# Patient Record
Sex: Male | Born: 1980 | Race: White | Hispanic: No | Marital: Single | Smoking: Current every day smoker
Health system: Southern US, Community
[De-identification: ages and names within clinical notes are randomized; demographics above are authoritative.]

## PROBLEM LIST (undated history)

## (undated) DIAGNOSIS — R29898 Other symptoms and signs involving the musculoskeletal system: Secondary | ICD-10-CM

## (undated) HISTORY — DX: Other symptoms and signs involving the musculoskeletal system: R29.898

---

## 2018-03-02 ENCOUNTER — Ambulatory Visit: Payer: Self-pay | Admitting: *Deleted

## 2018-03-02 NOTE — Telephone Encounter (Signed)
Pt reports left "wrist drop" and tingling of thumb, index and middle finger left hand. States Saturday night fell asleep in chair "In awkward position." States yesterday am symptoms started but "I thought it was from sleeping on it and it would go away." States "Electrical sensation " to thumb and fingers is intermittent.   Also reports "Wrist drop." States can not lift hand from wrist. Denies any other weakness, no SOB, CP, no facial droop. Denies any warmth to area, no coldness or discoloration to fingers. Reports "Slight swelling, I've been trying to keep it up."  Pt had established care with Dr. Alvy Bimler ; appt. 03/09/18.  Appt made for tomorrow with Dr. Creta Levin for this acute issue. Care advise given per protocol. Instructed to go to ED if symptoms worsen, new symptoms occur.   Reason for Disposition . Weakness (i.e., loss of strength) of new onset in hand or fingers  (Exceptions: not truly weak, hand feels weak because of pain; weakness present > 2 weeks)  Answer Assessment - Initial Assessment Questions 1. ONSET: "When did the pain start?"     Yesterday am 2. LOCATION: "Where is the pain located?"     Left thumb, index and middle finger; left wrist drop 3. PAIN: "How bad is the pain?" (Scale 1-10; or mild, moderate, severe)   - MILD (1-3): doesn't interfere with normal activities   - MODERATE (4-7): interferes with normal activities (e.g., work or school) or awakens from sleep   - SEVERE (8-10): excruciating pain, unable to use hand at all     "Electrical type pain left thumb, first two fingers." 4. WORK OR EXERCISE: "Has there been any recent work or exercise that involved this part of the body?"     "Could have injured it and not realized. Happens in my line of work." 5. CAUSE: "What do you think is causing the pain?"     Unsure 6. AGGRAVATING FACTORS: "What makes the pain worse?" (e.g., using computer)     No, intermittent 7. OTHER SYMPTOMS: "Do you have any other symptoms?" (e.g.,  neck pain, swelling, rash, numbness, fever)     "Very slight swelling. Feels like my hands asleep."  Protocols used: HAND AND WRIST PAIN-A-AH

## 2018-03-03 ENCOUNTER — Ambulatory Visit: Payer: No Typology Code available for payment source | Admitting: Family Medicine

## 2018-03-03 ENCOUNTER — Other Ambulatory Visit: Payer: Self-pay

## 2018-03-03 ENCOUNTER — Encounter: Payer: Self-pay | Admitting: Family Medicine

## 2018-03-03 VITALS — BP 132/79 | HR 67 | Temp 98.3°F | Resp 16 | Ht 76.0 in | Wt 198.0 lb

## 2018-03-03 DIAGNOSIS — M21332 Wrist drop, left wrist: Secondary | ICD-10-CM

## 2018-03-03 NOTE — Progress Notes (Signed)
Chief Complaint  Patient presents with  . tingling thumb, index and middle finger on left hand since 1    numbness and little rom, ? left hand palsy, wrist drop with burning, numbness and tingling in fingers and thumb.    HPI  He works in [employer] and goes through crawl spaces He states that he fells asleep on Friday night 02/27/18 He states that he woke up Saturday morning he woke up with numbness of the wrist He fell asleep in the chair and when he work up had numbness and tingling and could not raise his wrist He only had one drink when this occurred He states that he has the ability to move his fingers when he supports his hand but cannot use his left hand to do anything He can unzip his jacket He can't move his wrist at all He has some burning sensation in the inner grove of his arm pit where he was slounced over the chair Hot things feel hotter than normal  He has a delayed sensation to pain but once he notices the pain it seems very intense.    History reviewed. No pertinent past medical history.  No current outpatient medications on file.   No current facility-administered medications for this visit.     Allergies:  Allergies  Allergen Reactions  . Penicillins Anaphylaxis    History reviewed. No pertinent surgical history.  Social History   Socioeconomic History  . Marital status: Unknown    Spouse name: Not on file  . Number of children: Not on file  . Years of education: Not on file  . Highest education level: Not on file  Occupational History  . Not on file  Social Needs  . Financial resource strain: Not on file  . Food insecurity:    Worry: Not on file    Inability: Not on file  . Transportation needs:    Medical: Not on file    Non-medical: Not on file  Tobacco Use  . Smoking status: Current Every Day Smoker    Types: Cigarettes  . Smokeless tobacco: Never Used  Substance and Sexual Activity  . Alcohol use: Yes    Alcohol/week: 2.0 - 3.0  standard drinks    Types: 2 - 3 Cans of beer per week  . Drug use: Never    Types: Marijuana  . Sexual activity: Not on file  Lifestyle  . Physical activity:    Days per week: Not on file    Minutes per session: Not on file  . Stress: Not on file  Relationships  . Social connections:    Talks on phone: Not on file    Gets together: Not on file    Attends religious service: Not on file    Active member of club or organization: Not on file    Attends meetings of clubs or organizations: Not on file    Relationship status: Not on file  Other Topics Concern  . Not on file  Social History Narrative  . Not on file    History reviewed. No pertinent family history.   ROS Review of Systems See HPI Constitution: No fevers or chills No malaise No diaphoresis Skin: No rash or itching Eyes: no blurry vision, no double vision GU: no dysuria or hematuria Neuro: no dizziness or headaches, see hpi  all others reviewed and negative   Objective: Vitals:   03/03/18 1415  BP: 132/79  Pulse: 67  Resp: 16  Temp: 98.3 F (36.8  C)  TempSrc: Oral  SpO2: 97%  Weight: 198 lb (89.8 kg)  Height: 6\' 4"  (1.93 m)    Physical Exam  Constitutional: He is oriented to person, place, and time. He appears well-developed and well-nourished.  HENT:  Head: Normocephalic and atraumatic.  Eyes: Conjunctivae and EOM are normal.  Cardiovascular: Normal rate, regular rhythm and normal heart sounds.  Pulmonary/Chest: Effort normal and breath sounds normal. No stridor. No respiratory distress.  Neurological: He is alert and oriented to person, place, and time. A sensory deficit is present. No cranial nerve deficit. Gait normal.  Reflex Scores:      Bicep reflexes are 2+ on the right side and 1+ on the left side.      Brachioradialis reflexes are 2+ on the right side and 1+ on the left side. With pin-prick from monofilament he states it feels fuzzy not sharp on the left But sharp on the right  Motor  strength R upper extremity 5/5 Left upper extremity 4/5 Weakness noted at the triceps and BR Weakness at the wrist 1/5   Skin: Skin is warm. Capillary refill takes less than 2 seconds.  Psychiatric: He has a normal mood and affect. His behavior is normal. Judgment and thought content normal.    Assessment and Plan Kenyon was seen today for tingling thumb, index and middle finger on left hand since 1.  Diagnoses and all orders for this visit:  Wrist drop, left -     NCV with EMG(electromyography); Future -     Splint wrist  concerning for Saturday night palsy Gave work note with restrictions Seems to be in the radial nerve distribution Advise wrist splint to support the wrist and precautions Follow up for EMG   Jaicob Dia A [employer]

## 2018-03-03 NOTE — Patient Instructions (Addendum)
If you have lab work done today you will be contacted with your lab results within the next 2 weeks.  If you have not heard from [place] then please contact us. The fastest way to get your results is to register for My Chart.   IF you received an x-ray today, you will receive an invoice from [facility]. Please contact [facility] at [phone] with questions or concerns regarding your invoice.   IF you received labwork today, you will receive an invoice from [facility]. Please contact [facility] at [phone] with questions or concerns regarding your invoice.   Our billing staff will not be able to assist you with questions regarding bills from these companies.  You will be contacted with the lab results as soon as they are available. The fastest way to get your results is to activate your My Chart account. Instructions are located on the last page of this paperwork. If you have not heard from [place] regarding the results in 2 weeks, please contact this office.     Radial Nerve Palsy The loss of function of the radial nerve in your arm or hand is called radial nerve palsy. The radial nerve extends from your shoulder, around the back of your upper arm, and down along the outside of your lower arm. An injury to this nerve causes certain muscles and tendons in your arm and wrist to not work properly, which leads to a condition known as wrist drop. This means that you cannot extend your wrist. If you are standing with your arm stretched straight out in front of you, your wrist will bend and your hand will drop down toward the floor. An injury to the radial nerve may also result in lost feeling (sensation) in parts of your arm. What are the causes? Common causes of this condition include:  A break (fracture) of your upper arm bone (humerus) or the upper part of one of the lower arm bones (radius). This is because the radial nerve winds around both of these bones.  Complications of  the surgical repair of a fractured humerus or radius.  Improper use of crutches. Crutches that are too long can put pressure on the radial nerve where it passes through the armpit (crutch palsy).  Keeping your arm in a position for a long time that places pressure on the radial nerve, such as having your arm over the back of a chair, or during sleep (Saturday night palsy).  Performing repetitive activities that involve rotation of your lower arm or movement of your wrist (repetitive use injury).  What increases the risk? This condition is more likely to develop if you have:  Diabetes.  Rheumatoid arthritis.  Hypothyroidism.  What are the signs or symptoms? Symptoms of this condition include:  Being unable to extend your wrist.  Having difficulty straightening out your elbow in addition to your wrist.  Having numbness or tingling in the back of your arm, forearm, or hand.  How is this diagnosed? This condition is diagnosed with a thorough history of the injury or the circumstances that led up to the wrist drop. Your health care provider will examine your shoulder, arm, wrist, and hand. To confirm the diagnosis, you may also have tests, including:  Nerve conduction studies. These tests show if the radial nerve is sending electrical signals well. If not, the test can be used to figure out where the problem is within the radial nerve.  X-rays. These may be done if your  health care provider suspects that you have an injury to one or more bones in your arm.  MRI. This may be used to determine the cause of your radial nerve palsy, or to make sure there is not another condition causing your symptoms.  How is this treated? Treatment for this condition depends on the cause.  Removing pressure on the radial nerve. This is done if the condition is caused by pressure on the nerve. Using this treatment may allow the nerve to go back to normal within a few weeks or a few months.  Physical  and occupational therapy. Your health care provider may ask you to work with a therapist to regain strength in your hand and wrist and perform stretching exercises to maintain range of motion of your wrist and elbow.  Splinting. Your therapist may make one or more splints for you to wear during the day or at night to help with motion and positioning of your wrist.  Medicines. ? A steroid injection may be used to decrease swelling around the nerve. ? Nonsteroidal anti-inflammatory drugs (NSAIDs) may be used to control pain.  Surgery. Depending on the cause of your radial nerve palsy, surgery may be required to: ? Remove pressure on the nerve (entrapment). ? Repair one or more broken bones. ? Relocate (transfer) tendons in your lower arm to help you regain strength and mobility of your wrist. ? Transfer of a nerve to the injury site to restore nerve function.  Follow these instructions at home:  Follow your health care provider's instructions about how to protect your hand and wrist.  If you have been given one or more braces to wear, use them as directed by your health care provider or therapist.  Protect your hand from extreme temperature injuries, such as burns and frostbite, as directed by your health care provider or therapist.  Exercise your hand, wrist, and arm on a regular basis as directed by your health care provider or therapist. Contact a health care provider if:  You have a sudden increase in pain.  You develop new numbness or new loss of sensation in your hand.  You have a sudden change in your ability to move your arm, wrist, or hand.  Your fingers become bluish in color or they feel cold to the touch. This information is not intended to replace advice given to you by your health care provider. Make sure you discuss any questions you have with your health care provider. Document Released: 12/13/2005 Document Revised: 09/14/2015 Document Reviewed: 06/23/2013 [employer]  Interactive Patient Education  2018 [employer]

## 2018-03-04 ENCOUNTER — Encounter: Payer: Self-pay | Admitting: Family Medicine

## 2018-03-06 ENCOUNTER — Emergency Department (HOSPITAL_COMMUNITY): Payer: No Typology Code available for payment source

## 2018-03-06 ENCOUNTER — Telehealth: Payer: Self-pay | Admitting: Family Medicine

## 2018-03-06 ENCOUNTER — Other Ambulatory Visit: Payer: Self-pay

## 2018-03-06 ENCOUNTER — Encounter (HOSPITAL_COMMUNITY): Payer: Self-pay | Admitting: Emergency Medicine

## 2018-03-06 ENCOUNTER — Emergency Department (HOSPITAL_COMMUNITY)
Admission: EM | Admit: 2018-03-06 | Discharge: 2018-03-06 | Disposition: A | Discharge status: Home / Self Care | Payer: No Typology Code available for payment source | Attending: Emergency Medicine | Admitting: Emergency Medicine

## 2018-03-06 DIAGNOSIS — M25532 Pain in left wrist: Secondary | ICD-10-CM | POA: Diagnosis present

## 2018-03-06 DIAGNOSIS — F17210 Nicotine dependence, cigarettes, uncomplicated: Secondary | ICD-10-CM | POA: Diagnosis not present

## 2018-03-06 DIAGNOSIS — G5632 Lesion of radial nerve, left upper limb: Secondary | ICD-10-CM | POA: Diagnosis not present

## 2018-03-06 DIAGNOSIS — R200 Anesthesia of skin: Secondary | ICD-10-CM | POA: Insufficient documentation

## 2018-03-06 DIAGNOSIS — M5020 Other cervical disc displacement, unspecified cervical region: Secondary | ICD-10-CM

## 2018-03-06 LAB — I-STAT CHEM 8, ED
BUN: 11 mg/dL (ref 6–20)
CALCIUM ION: 1.16 mmol/L (ref 1.15–1.40)
CHLORIDE: 102 mmol/L (ref 98–111)
Creatinine, Ser: 0.9 mg/dL (ref 0.61–1.24)
GLUCOSE: 83 mg/dL (ref 70–99)
HCT: 51 % (ref 39.0–52.0)
Hemoglobin: 17.3 g/dL — ABNORMAL HIGH (ref 13.0–17.0)
Potassium: 5.2 mmol/L — ABNORMAL HIGH (ref 3.5–5.1)
SODIUM: 137 mmol/L (ref 135–145)
TCO2: 28 mmol/L (ref 22–32)

## 2018-03-06 LAB — URINALYSIS, ROUTINE W REFLEX MICROSCOPIC
BILIRUBIN URINE: NEGATIVE
Glucose, UA: NEGATIVE mg/dL
Hgb urine dipstick: NEGATIVE
KETONES UR: NEGATIVE mg/dL
Leukocytes, UA: NEGATIVE
Nitrite: NEGATIVE
PH: 6 (ref 5.0–8.0)
Protein, ur: NEGATIVE mg/dL
Specific Gravity, Urine: 1.009 (ref 1.005–1.030)

## 2018-03-06 LAB — CBC
HEMATOCRIT: 52.2 % — AB (ref 39.0–52.0)
HEMOGLOBIN: 16.5 g/dL (ref 13.0–17.0)
MCH: 30.4 pg (ref 26.0–34.0)
MCHC: 31.6 g/dL (ref 30.0–36.0)
MCV: 96.3 fL (ref 80.0–100.0)
Platelets: 226 10*3/uL (ref 150–400)
RBC: 5.42 MIL/uL (ref 4.22–5.81)
RDW: 12.6 % (ref 11.5–15.5)
WBC: 9.1 10*3/uL (ref 4.0–10.5)
nRBC: 0 % (ref 0.0–0.2)

## 2018-03-06 LAB — DIFFERENTIAL
Abs Immature Granulocytes: 0.04 10*3/uL (ref 0.00–0.07)
Basophils Absolute: 0 10*3/uL (ref 0.0–0.1)
Basophils Relative: 0 %
Eosinophils Absolute: 0.2 10*3/uL (ref 0.0–0.5)
Eosinophils Relative: 2 %
IMMATURE GRANULOCYTES: 0 %
LYMPHS ABS: 2.6 10*3/uL (ref 0.7–4.0)
LYMPHS PCT: 29 %
Monocytes Absolute: 0.7 10*3/uL (ref 0.1–1.0)
Monocytes Relative: 7 %
NEUTROS ABS: 5.5 10*3/uL (ref 1.7–7.7)
Neutrophils Relative %: 62 %

## 2018-03-06 LAB — RAPID URINE DRUG SCREEN, HOSP PERFORMED
AMPHETAMINES: NOT DETECTED
BARBITURATES: NOT DETECTED
Benzodiazepines: NOT DETECTED
Cocaine: NOT DETECTED
OPIATES: NOT DETECTED
TETRAHYDROCANNABINOL: NOT DETECTED

## 2018-03-06 LAB — APTT: aPTT: 33 seconds (ref 24–36)

## 2018-03-06 LAB — COMPREHENSIVE METABOLIC PANEL
ALBUMIN: 4.4 g/dL (ref 3.5–5.0)
ALK PHOS: 52 U/L (ref 38–126)
ALT: 13 U/L (ref 0–44)
AST: 17 U/L (ref 15–41)
Anion gap: 9 (ref 5–15)
BUN: 7 mg/dL (ref 6–20)
CALCIUM: 9.8 mg/dL (ref 8.9–10.3)
CO2: 28 mmol/L (ref 22–32)
CREATININE: 1.01 mg/dL (ref 0.61–1.24)
Chloride: 101 mmol/L (ref 98–111)
GFR calc Af Amer: >60 mL/min (ref >60)
GFR calc non Af Amer: >60 mL/min (ref >60)
GLUCOSE: 86 mg/dL (ref 70–99)
Potassium: 5.1 mmol/L (ref 3.5–5.1)
Sodium: 138 mmol/L (ref 135–145)
Total Bilirubin: 0.5 mg/dL (ref 0.3–1.2)
Total Protein: 7.6 g/dL (ref 6.5–8.1)

## 2018-03-06 LAB — ETHANOL

## 2018-03-06 LAB — I-STAT TROPONIN, ED: Troponin i, poc: 0 ng/mL (ref 0.00–0.08)

## 2018-03-06 LAB — PROTIME-INR
INR: 0.95
Prothrombin Time: 12.5 seconds (ref 11.4–15.2)

## 2018-03-06 MED ORDER — PREDNISONE 10 MG PO TABS: 40.0000 mg | ORAL_TABLET | Freq: Every day | ORAL | 0 refills | Status: DC

## 2018-03-06 MED ORDER — METHYLPREDNISOLONE SODIUM SUCC 125 MG IJ SOLR
125.0000 mg | Freq: Once | INTRAMUSCULAR | Status: AC
  Administered 2018-03-06: 125 mg via INTRAVENOUS
  Filled 2018-03-06: qty 2

## 2018-03-06 NOTE — ED Provider Notes (Signed)
[facility] EMERGENCY DEPARTMENT Provider Note   CSN: [id number] Arrival date & time: 03/06/18  1229     History   Chief Complaint Chief Complaint  Patient presents with  . Wrist Pain    HPI Sean Barnes is a 37 y.o. male who presents to ED for 1 week history of nondominant left wrist drop.  States that he woke up one morning with the inability to extend his left wrist.  He began developing paresthesias and "numbness" on the dorsum of his hand that began at the fingertips and has now radiated up his arm.  He states that the paresthesias are more severe in his first through third digits.  He denies any history of similar symptoms in the past.  He was seen and evaluated by a provider a few days ago with inconclusive diagnosis or treatment plan.  He has not taken any medications to help with his symptoms.  He states that "it is very possible that I could have slept on it wrong a week ago but I cannot remember."  He denies any gait changes, injuries or falls, headache, vision changes.  HPI  History reviewed. No pertinent past medical history.  There are no active problems to display for this patient.   History reviewed. No pertinent surgical history.      Home Medications    Prior to Admission medications   Not on File    Family History No family history on file.  Social History Social History   Tobacco Use  . Smoking status: Current Every Day Smoker    Types: Cigarettes  . Smokeless tobacco: Never Used  Substance Use Topics  . Alcohol use: Yes    Alcohol/week: 2.0 - 3.0 standard drinks    Types: 2 - 3 Cans of beer per week  . Drug use: Never    Types: Marijuana     Allergies   Penicillins   Review of Systems Review of Systems  Constitutional: Negative for appetite change, chills and fever.  HENT: Negative for ear pain, rhinorrhea, sneezing and sore throat.   Eyes: Negative for photophobia and visual disturbance.  Respiratory: Negative  for cough, chest tightness, shortness of breath and wheezing.   Cardiovascular: Negative for chest pain and palpitations.  Gastrointestinal: Negative for abdominal pain, blood in stool, constipation, diarrhea, nausea and vomiting.  Genitourinary: Negative for dysuria, hematuria and urgency.  Musculoskeletal: Negative for myalgias.  Skin: Negative for rash.  Neurological: Positive for weakness and numbness. Negative for dizziness and light-headedness.     Physical Exam Updated Vital Signs BP 129/73 (BP Location: Left Arm)   Pulse 60   Temp 98.4 F (36.9 C) (Oral)   Resp 16   Wt 89.8 kg   SpO2 99%   BMI 24.10 kg/m   Physical Exam  Constitutional: He is oriented to person, place, and time. He appears well-developed and well-nourished. No distress.  Normal gait. NAD.  HENT:  Head: Normocephalic and atraumatic.  Nose: Nose normal.  Eyes: Conjunctivae and EOM are normal. Left eye exhibits no discharge. No scleral icterus.  Neck: Normal range of motion. Neck supple.  Cardiovascular: Normal rate, regular rhythm, normal heart sounds and intact distal pulses. Exam reveals no gallop and no friction rub.  No murmur heard. Pulmonary/Chest: Effort normal and breath sounds normal. No respiratory distress.  Abdominal: Soft. Bowel sounds are normal. He exhibits no distension. There is no tenderness. There is no guarding.  Musculoskeletal: Normal range of motion. He exhibits no edema.  Wrist drop noted of left wrist.  Patient notes "different" sensation to touch of left upper extremity.  Good capillary refill noted.  2+ radial pulse palpated.  Weakness with grip strength noted compared to right.  No tenderness to palpation of the left wrist or elbow.  Decreased strength on LUE triceps and biceps.  Neurological: He is alert and oriented to person, place, and time. A sensory deficit is present. No cranial nerve deficit. He exhibits normal muscle tone. Coordination normal.  Pupils reactive. No facial  asymmetry noted. Cranial nerves appear grossly intact. Sensation intact to light touch on face.  Skin: Skin is warm and dry. No rash noted.  Psychiatric: He has a normal mood and affect.  Nursing note and vitals reviewed.    ED Treatments / Results  Labs (all labs ordered are listed, but only abnormal results are displayed) Labs Reviewed  CBC - Abnormal; Notable for the following components:      Result Value   HCT 52.2 (*)    All other components within normal limits  I-STAT CHEM 8, ED - Abnormal; Notable for the following components:   Potassium 5.2 (*)    Hemoglobin 17.3 (*)    All other components within normal limits  ETHANOL  PROTIME-INR  APTT  DIFFERENTIAL  COMPREHENSIVE METABOLIC PANEL  URINALYSIS, ROUTINE W REFLEX MICROSCOPIC  RAPID URINE DRUG SCREEN, HOSP PERFORMED  I-STAT TROPONIN, ED    EKG EKG Interpretation  Date/Time:  Friday March 06 2018 13:23:53 EST Ventricular Rate:  77 PR Interval:    QRS Duration: 103 QT Interval:  374 QTC Calculation: 424 R Axis:   130 Text Interpretation:  Sinus rhythm Biatrial enlargement Anterior infarct, old ST elevation, consider inferior injury Baseline wander in lead(s) V6 Confirmed by Donnetta Hutching ([id number]), editor Manson Passey, Deirdre [phone]) on 03/06/2018 2:04:20 PM   Radiology Ct Head Wo Contrast  Result Date: 03/06/2018 CLINICAL DATA:  Numbness left forearm to fingers for several weeks. There is poor pronation of the left hand. EXAM: CT HEAD WITHOUT CONTRAST CT CERVICAL SPINE WITHOUT CONTRAST TECHNIQUE: Multidetector CT imaging of the head and cervical spine was performed following the standard protocol without intravenous contrast. Multiplanar CT image reconstructions of the cervical spine were also generated. COMPARISON:  None. FINDINGS: CT HEAD FINDINGS BRAIN: The ventricles and sulci are normal. No intraparenchymal hemorrhage, mass effect nor midline shift. No acute large vascular territory infarcts. No abnormal  extra-axial fluid collections. Basal cisterns are midline and not effaced. No acute cerebellar abnormality. VASCULAR: Unremarkable. SKULL/SOFT TISSUES: No skull fracture. No significant soft tissue swelling. ORBITS/SINUSES: The included ocular globes and orbital contents are normal.The mastoid air-cells and included paranasal sinuses are well-aerated. OTHER: None. CT CERVICAL SPINE FINDINGS ALIGNMENT: Vertebral bodies in alignment. Maintained lordosis. Mild dextroconvex curvature of the lower cervical spine with apex at C6. SKULL BASE AND VERTEBRAE: Cervical vertebral bodies and posterior elements are intact. No destructive or suspicious osseous lesions. C1-2 articulation maintained. Uncinate spurring at C5-6 bilaterally from uncovertebral joint osteoarthritis. SOFT TISSUES AND SPINAL CANAL: No prevertebral soft tissue swelling. No visible canal hematoma. DISC LEVELS: C2-C3: Small central disc protrusion. No significant central foraminal stenosis. C3-C4:  Negative C4-C5: Moderate disc flattening with mild central to left central disc bulge. No significant central or foraminal stenosis. C5-C6: Moderate left central to subarticular disc protrusion with left central osteophytes noted which may affect the exiting nerve roots at C5-6. This may be better characterized with MRI. C6-C7:  Negative C7-T1:  Negative UPPER CHEST: Lung apices are  clear. OTHER: None. IMPRESSION: 1. Normal head CT. 2. Moderate left central to subarticular disc protrusion at C5-6 with dorsal osteophytes which may affect the left exiting nerve roots at this level. This may be better characterized with MRI. 3. Small central disc protrusion C2-3. 4. Left mild central disc bulge C4-5. Electronically Signed   By: Tollie Eth M.D.   On: 03/06/2018 15:07   Ct Cervical Spine Wo Contrast  Result Date: 03/06/2018 CLINICAL DATA:  Numbness left forearm to fingers for several weeks. There is poor pronation of the left hand. EXAM: CT HEAD WITHOUT CONTRAST  CT CERVICAL SPINE WITHOUT CONTRAST TECHNIQUE: Multidetector CT imaging of the head and cervical spine was performed following the standard protocol without intravenous contrast. Multiplanar CT image reconstructions of the cervical spine were also generated. COMPARISON:  None. FINDINGS: CT HEAD FINDINGS BRAIN: The ventricles and sulci are normal. No intraparenchymal hemorrhage, mass effect nor midline shift. No acute large vascular territory infarcts. No abnormal extra-axial fluid collections. Basal cisterns are midline and not effaced. No acute cerebellar abnormality. VASCULAR: Unremarkable. SKULL/SOFT TISSUES: No skull fracture. No significant soft tissue swelling. ORBITS/SINUSES: The included ocular globes and orbital contents are normal.The mastoid air-cells and included paranasal sinuses are well-aerated. OTHER: None. CT CERVICAL SPINE FINDINGS ALIGNMENT: Vertebral bodies in alignment. Maintained lordosis. Mild dextroconvex curvature of the lower cervical spine with apex at C6. SKULL BASE AND VERTEBRAE: Cervical vertebral bodies and posterior elements are intact. No destructive or suspicious osseous lesions. C1-2 articulation maintained. Uncinate spurring at C5-6 bilaterally from uncovertebral joint osteoarthritis. SOFT TISSUES AND SPINAL CANAL: No prevertebral soft tissue swelling. No visible canal hematoma. DISC LEVELS: C2-C3: Small central disc protrusion. No significant central foraminal stenosis. C3-C4:  Negative C4-C5: Moderate disc flattening with mild central to left central disc bulge. No significant central or foraminal stenosis. C5-C6: Moderate left central to subarticular disc protrusion with left central osteophytes noted which may affect the exiting nerve roots at C5-6. This may be better characterized with MRI. C6-C7:  Negative C7-T1:  Negative UPPER CHEST: Lung apices are clear. OTHER: None. IMPRESSION: 1. Normal head CT. 2. Moderate left central to subarticular disc protrusion at C5-6 with  dorsal osteophytes which may affect the left exiting nerve roots at this level. This may be better characterized with MRI. 3. Small central disc protrusion C2-3. 4. Left mild central disc bulge C4-5. Electronically Signed   By: Tollie Eth M.D.   On: 03/06/2018 15:07    Procedures Procedures (including critical care time)  Medications Ordered in ED Medications  methylPREDNISolone sodium succinate (SOLU-MEDROL) 125 mg/2 mL injection 125 mg (125 mg Intravenous Given 03/06/18 1547)     Initial Impression / Assessment and Plan / ED Course  I have reviewed the triage vital signs and the nursing notes.  Pertinent labs & imaging results that were available during my care of the patient were reviewed by me and considered in my medical decision making (see chart for details).     37 year old male presents to ED for 1 week history of nondominant left wrist drop, paresthesias and "numbness."  States that symptoms began 1 week ago and he is unsure if he slept on his arm incorrectly.  He was seen and evaluated 3 days ago for the symptoms but his symptoms have not improved despite wearing the wrist brace.  On exam there is left-sided wrist drop noted.  There is decreased strength noted in the left upper extremity although there is full active and passive range of motion  of shoulder and elbow.  Patient is emulating with a normal gait.  No facial droop or neurological deficits of lower extremity noted.  Plan is to obtain baseline lab work, CT of the head and neck, MRI of the cervical spine to rule out central cause of symptoms.  Baseline lab work CBC, troponin, CMP unremarkable.  CT of the head and cervical spine significant for moderate left central to subarticular disc extrusion at C5-C6 which may be affecting the nerve roots at this level.  Will need to still obtain cervical spinal MRI and reassess.  We will give patient a dose of Solu-Medrol. Dr. Otho Najjarozier to disposition patient after MR results.  Patient  discussed with and seen by my attending, Dr. Silverio LayYao.   Final Clinical Impressions(s) / ED Diagnoses   Final diagnoses:  Radial nerve palsy, left    ED Discharge Orders    None       Dietrich PatesKhatri, Undra Harriman, PA-C 03/06/18 1611    Charlynne PanderYao, David Hsienta, MD 03/09/18 [id number]

## 2018-03-06 NOTE — Telephone Encounter (Signed)
I put in for an EMG

## 2018-03-06 NOTE — ED Triage Notes (Signed)
Pt in from home with c/o L wrist pain, and L posterior ankle pain x 1 wk. Pt states he has numbness from L forearm to fingers, poor pronation but able to move fingers dependent on certain positioning.

## 2018-03-06 NOTE — Telephone Encounter (Signed)
Was there a referral that was suppose to be placed for this patient or did he need to make a follow up appointment. There are no notes stating either in his chart.  Please advise--also looks as though patient was seen in ER today 03/06/18

## 2018-03-06 NOTE — Telephone Encounter (Signed)
Copied from CRM [id number]. Topic: General - Other >> Mar 05, 2018  2:04 PM Lynne LoganHudson, Caryn D wrote: Reason for CRM: Pt was calling to check on the status of next steps regarding his wrist. Pt stated Dr. Creta LevinStallings told him someone would be calling him to schedule but he has not heard anything. Please advise Cb#Comm Pref:  Works at [employer] [other [address]  [place] [id number] [phone]

## 2018-03-06 NOTE — ED Provider Notes (Signed)
Transfer of Care Note I [facility] care [facility].  Agree with history, physical exam and plan.  See their note for further details.    Briefly, the patient is a 37 yo male who presents with left sided wrist drop.  Current plan is as follows: Patient is currently awaiting results of an MRI of his cervical spine.   Reassessment: I personally reassessed the patient.  Continues to have left-sided wrist drop but no acute changes in his condition.  MRI is read of having region with accompanying osteophytes of the left at C5/C6 which could compress the left C6 nerve.  This finding was discussed with the patient at bedside.  After discussing these findings he is amenable to following up with neurosurgery as an outpatient and starting a short course of steroids at this time.  Patient will call the neurosurgery office tomorrow with the information provided and will return to the emerge department immediately should his symptoms acutely change or if any new, concerning symptoms should arise.  Patient no acute distress at time of discharge.  The care of this patient was supervised by Dr. Clarene DukeLittle, who agreed with the plan and management of the patient.   Clinical Impression: 1. Radial nerve palsy, left   2. Compressed cervical disc     ED Dispo: Discharge    Keith Rakeozier, Merion Grimaldo T, MD 03/06/18 1932    Clarene DukeLittle, Ambrose Finlandachel Morgan, MD 03/07/18 [phone]

## 2018-03-09 ENCOUNTER — Ambulatory Visit: Payer: Self-pay | Admitting: Emergency Medicine

## 2018-03-11 NOTE — Progress Notes (Signed)
Patient ID: Sean Barnes, male   DOB: 12-30-1980, 37 y.o.   MRN: [id number]      Sean IshiharaRobert Barnes, is a 37 y.o. male  NWG:[id number]  VHQ:[id number]  DOB - 12-30-1980  Subjective:  Chief Complaint and HPI: Sean Barnes is a 37 y.o. male here today Presented to ED with L wrist droop on 03/06/2018.  He awakened acutely with it on 03/06/2018.  There was no precipitating factors or injury.  +weakness.  Appt with neurosurgery on Monday with [facility].  No improvement with prednisone.  +occasional shooting nerve pains.  MRI C-Spine  03/06/2018: IMPRESSION: Soft disc protrusion with accompanying osteophytes to the left at C5-6 which should compress the left C6 nerve.  CT head  03/06/2018 IMPRESSION: 1. Normal head CT. 2. Moderate left central to subarticular disc protrusion at C5-6 with dorsal osteophytes which may affect the left exiting nerve roots at this level. This may be better characterized with MRI. 3. Small central disc protrusion C2-3. 4. Left mild central disc bulge C4-5.  CT C-Spine: IMPRESSION: 1. Normal head CT. 2. Moderate left central to subarticular disc protrusion at C5-6 with dorsal osteophytes which may affect the left exiting nerve roots at this level. This may be better characterized with MRI. 3. Small central disc protrusion C2-3. 4. Left mild central disc bulge C4-5.   HPI Sean IshiharaRobert Barnes is a 37 y.o. male who presents to ED for 1 week history of nondominant left wrist drop.  States that he woke up one morning with the inability to extend his left wrist.  He began developing paresthesias and "numbness" on the dorsum of his hand that began at the fingertips and has now radiated up his arm.  He states that the paresthesias are more severe in his first through third digits.  He denies any history of similar symptoms in the past.  He was seen and evaluated by a provider a few days ago with inconclusive diagnosis or treatment plan.  He has not taken any medications to help  with his symptoms.  He states that "it is very possible that I could have slept on it wrong a week ago but I cannot remember."  He denies any gait changes, injuries or falls, headache, vision changes.  EKG: Date/Time:                  Friday March 06 2018 13:23:53 EST Ventricular Rate:         77 PR Interval:                   QRS Duration: 103 QT Interval:                 374 QTC Calculation:        424 R Axis:                         130 Text Interpretation:       Sinus rhythm Biatrial enlargement Anterior infarct, old ST elevation, consider inferior injury Baseline wander in lead(s) V6 Confirmed by Donnetta Hutchingook, Brian ([id number]), editor Manson PasseyBrown, Deirdre [phone]([id number]) on 03/06/2018 2:04:20 PM  Pertinent labs & imaging results that were available during my care of the patient were reviewed by me and considered in my medical decision making (see chart for details).   37 year old male presents to ED for 1 week history of nondominant left wrist drop, paresthesias and "numbness."  States that symptoms began 1 week ago and he is unsure if he  slept on his arm incorrectly.  He was seen and evaluated 3 days ago for the symptoms but his symptoms have not improved despite wearing the wrist brace.  On exam there is left-sided wrist drop noted.  There is decreased strength noted in the left upper extremity although there is full active and passive range of motion of shoulder and elbow.  Patient is emulating with a normal gait.  No facial droop or neurological deficits of lower extremity noted.  Plan is to obtain baseline lab work, CT of the head and neck, MRI of the cervical spine to rule out central cause of symptoms.  Baseline lab work CBC, troponin, CMP unremarkable.  CT of the head and cervical spine significant for moderate left central to subarticular disc extrusion at C5-C6 which may be affecting the nerve roots at this level.  Will need to still obtain cervical spinal MRI and reassess.  We will give patient a dose  of Solu-Medrol. Dr. Otho Najjar to disposition patient after MR results.  Patient discussed with and seen by my attending, Dr. Silverio Lay. ROS:   Constitutional:  No f/c, No night sweats, No unexplained weight loss. EENT:  No vision changes, No blurry vision, No hearing changes. No mouth, throat, or ear problems.  Respiratory: No cough, No SOB Cardiac: No CP, no palpitations GI:  No abd pain, No N/V/D. GU: No Urinary s/sx Musculoskeletal: No joint pain Neuro: No headache, no dizziness, + L arm and hand motor weakness.  Skin: No rash Endocrine:  No polydipsia. No polyuria.  Psych: Denies SI/HI  No problems updated.  ALLERGIES: Allergies  Allergen Reactions  . Penicillins Anaphylaxis    PAST MEDICAL HISTORY: No past medical history on file.  MEDICATIONS AT HOME: Prior to Admission medications   Not on File     Objective:  EXAM:   Vitals:   03/13/18 0843  BP: 120/72  Pulse: 71  Temp: 98 F (36.7 C)  TempSrc: Oral  SpO2: 99%  Weight: 198 lb 12.8 oz (90.2 kg)    General appearance : A&OX3. NAD. Non-toxic-appearing HEENT: Atraumatic and Normocephalic.  PERRLA. EOM intact.   Chest/Lungs:  Breathing-non-labored, Good air entry bilaterally, breath sounds normal without rales, rhonchi, or wheezing  CVS: S1 S2 regular, no murmurs, gallops, rubs  L triceps and brachoradialis reflexes diminished/absent.  Biceps=1+.  RUE reflexes 2+ and intact.  L wrist and hand with complete wrist drop and decreased ROM. Poor grip.  Poor strength.  Pulses=B Extremities: Bilateral Lower Ext shows no edema, both legs are warm to touch with = pulse throughout Neurology:  CN II-XII grossly intact, Non focal.   Psych:  TP linear. J/I WNL. Normal speech. Appropriate eye contact and affect.  Skin:  No Rash  Data Review No results found for: HGBA1C   Assessment & Plan   1. Left wristdrop No improvement after steroids. Keep neurosurgery appt for Monday.  2. Compression fracture of C6 vertebra,  initial encounter [facility]) neurosurgery  3. Encounter for examination following treatment at hospital No changes     Patient have been counseled extensively about nutrition and exercise  Return in about 6 weeks (around 04/24/2018) for assign PCP-CPE and fasting bloodwork.  The patient was given clear instructions to go to ER or return to medical center if symptoms don't improve, worsen or new problems develop. The patient verbalized understanding. The patient was told to call to get lab results if they haven't heard anything in the next week.     Georgian Co, PA-C  [facility] [place], [place] [phone]   03/13/2018, 9:03 AM

## 2018-03-13 ENCOUNTER — Ambulatory Visit: Payer: No Typology Code available for payment source | Attending: Family Medicine | Admitting: Physician Assistant

## 2018-03-13 VITALS — BP 120/72 | HR 71 | Temp 98.0°F | Wt 198.8 lb

## 2018-03-13 DIAGNOSIS — M21332 Wrist drop, left wrist: Secondary | ICD-10-CM

## 2018-03-13 DIAGNOSIS — Z09 Encounter for follow-up examination after completed treatment for conditions other than malignant neoplasm: Secondary | ICD-10-CM | POA: Diagnosis not present

## 2018-03-13 DIAGNOSIS — S12590A Other displaced fracture of sixth cervical vertebra, initial encounter for closed fracture: Secondary | ICD-10-CM

## 2018-03-18 ENCOUNTER — Encounter: Payer: Self-pay | Admitting: *Deleted

## 2018-03-23 ENCOUNTER — Ambulatory Visit: Payer: No Typology Code available for payment source | Admitting: Neurology

## 2018-03-23 ENCOUNTER — Telehealth: Payer: Self-pay | Admitting: *Deleted

## 2018-03-23 NOTE — Telephone Encounter (Signed)
Patient arrived to new patient appt without his copay.  He was rescheduled by check-in.

## 2018-04-24 ENCOUNTER — Ambulatory Visit: Payer: No Typology Code available for payment source | Admitting: Family Medicine

## 2018-05-21 ENCOUNTER — Encounter: Payer: Self-pay | Admitting: *Deleted

## 2018-05-25 ENCOUNTER — Ambulatory Visit: Payer: No Typology Code available for payment source | Admitting: Neurology

## 2018-05-25 ENCOUNTER — Encounter

## 2018-05-25 ENCOUNTER — Telehealth: Payer: Self-pay | Admitting: *Deleted

## 2018-05-25 NOTE — Telephone Encounter (Signed)
No showed new patient appointment. 

## 2018-05-26 ENCOUNTER — Encounter: Payer: Self-pay | Admitting: Neurology

## 2019-05-23 IMAGING — CT CT HEAD W/O CM
5 of 8 series · 16 of 47 positions shown, 17 images · non-contrast
Comparison: None.

CLINICAL DATA: Numbness left forearm to fingers for several weeks.
There is poor pronation of the left hand.

EXAM:
CT HEAD WITHOUT CONTRAST
CT CERVICAL SPINE WITHOUT CONTRAST
TECHNIQUE: Multidetector CT imaging of the head and cervical spine was
performed following the standard protocol without intravenous
contrast. Multiplanar CT image reconstructions of the cervical spine
were also generated.

[Series 4: head without · axial · non-contrast · 0.42mm/px · z∈[-58,-8]mm · 2 of 32 slices shown, 3 images]
[im 11/32  brain]
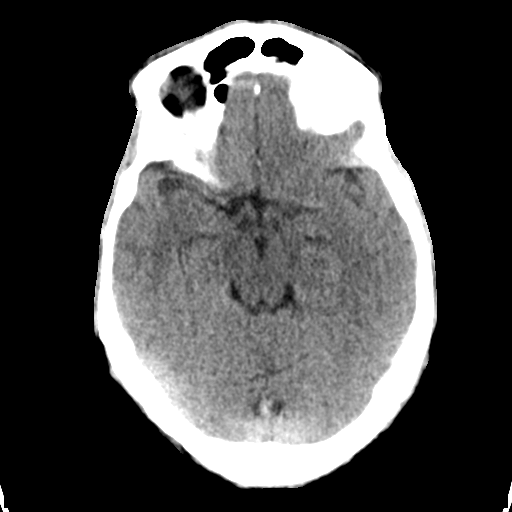
[im 11/32  bone]
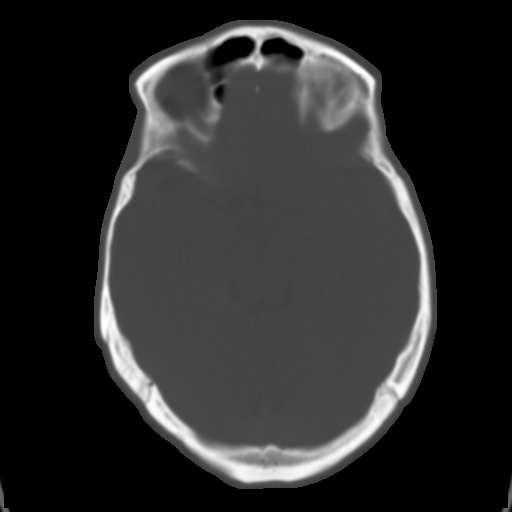
[im 21/32  brain]
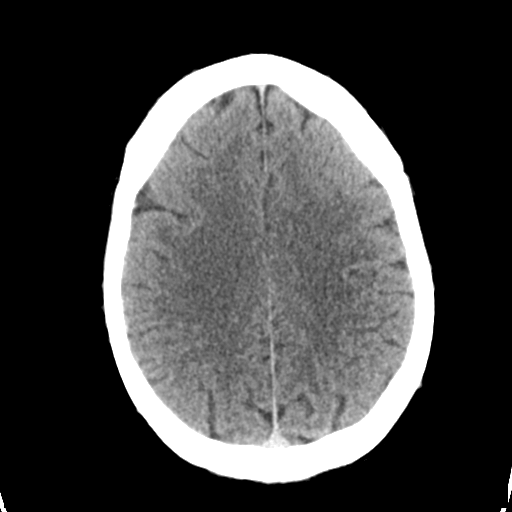

[Series 5: head bone · axial · 0.42mm/px · z∈[-86,+24]mm · 6 of 79 slices shown]
[im 12/79  bone]
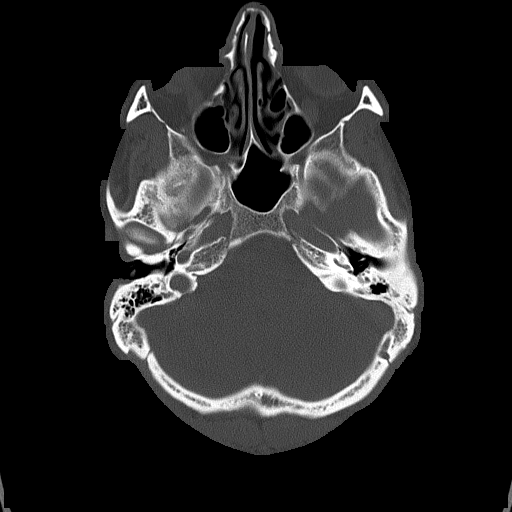
[im 23/79  bone]
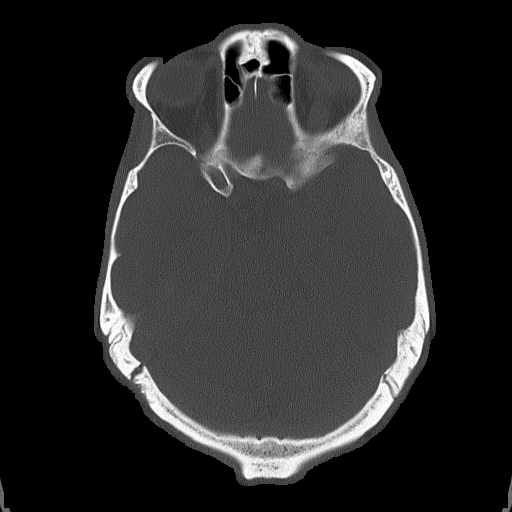
[im 34/79  bone]
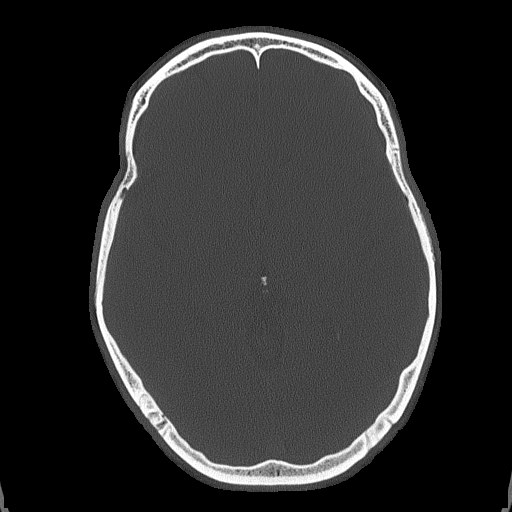
[im 45/79  bone]
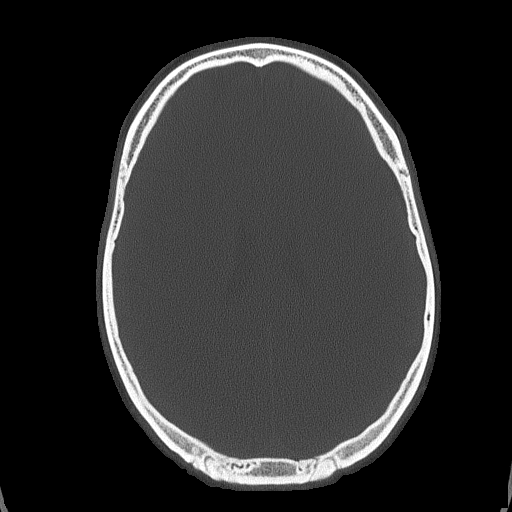
[im 56/79  bone]
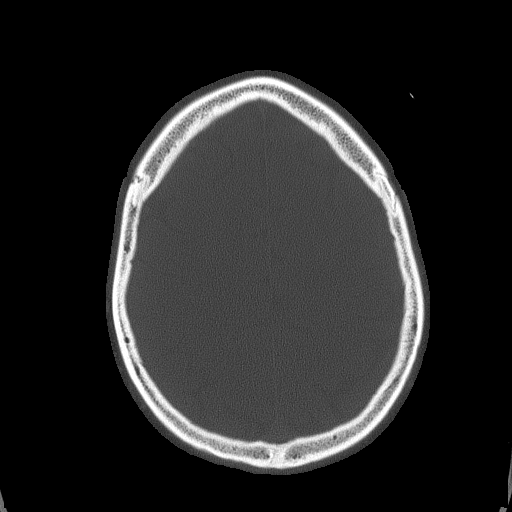
[im 67/79  bone]
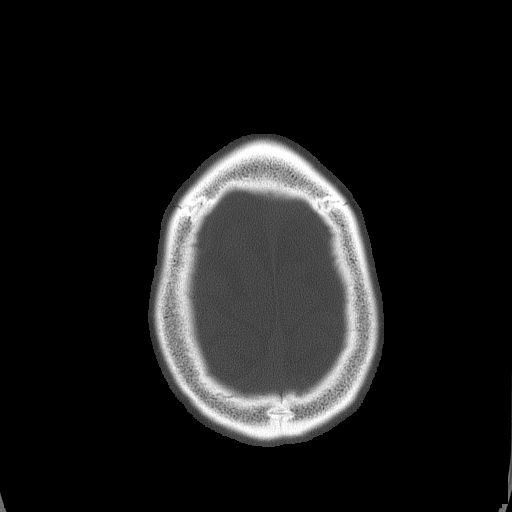

[Series 6: head without cor · coronal · non-contrast · 0.31mm/px · 3 of 74 slices shown]
[im 19/74  brain]
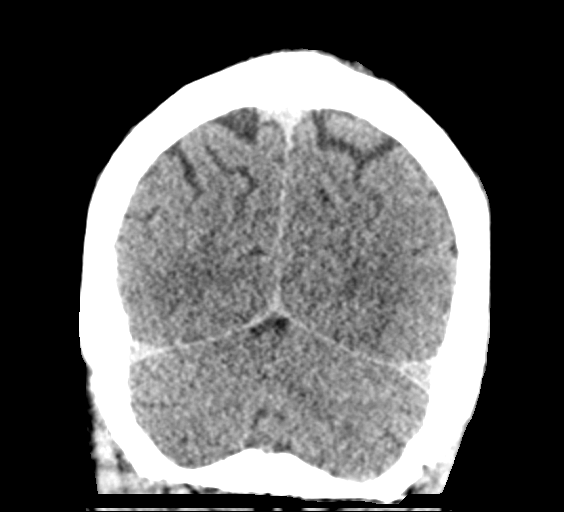
[im 37/74  brain]
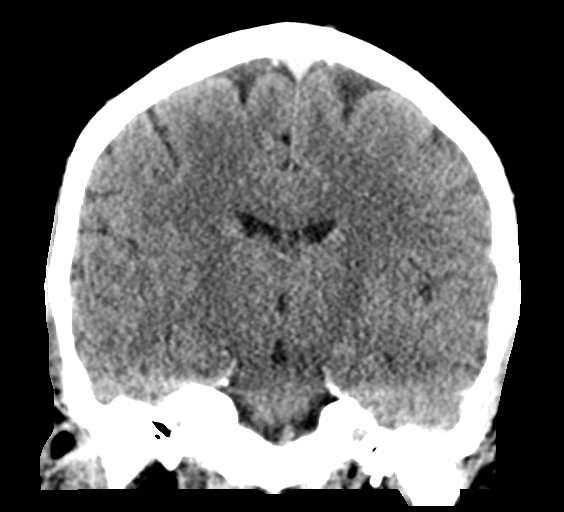
[im 55/74  brain]
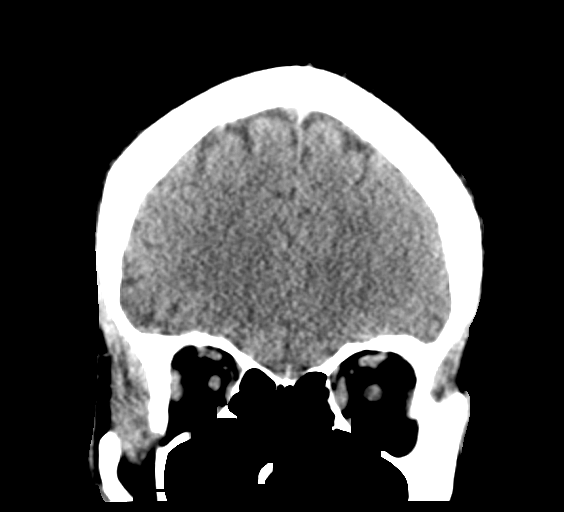

[Series 7: head without sag · sagittal · non-contrast · 0.31mm/px · 2 of 63 slices shown]
[im 21/63  brain]
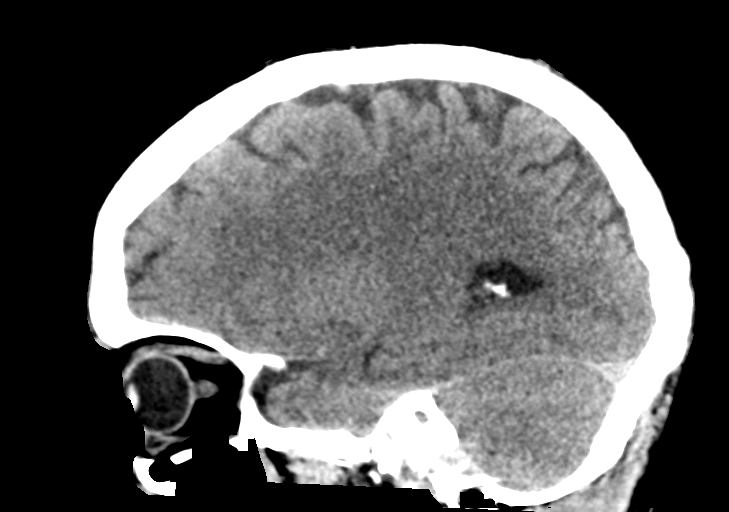
[im 42/63  brain]
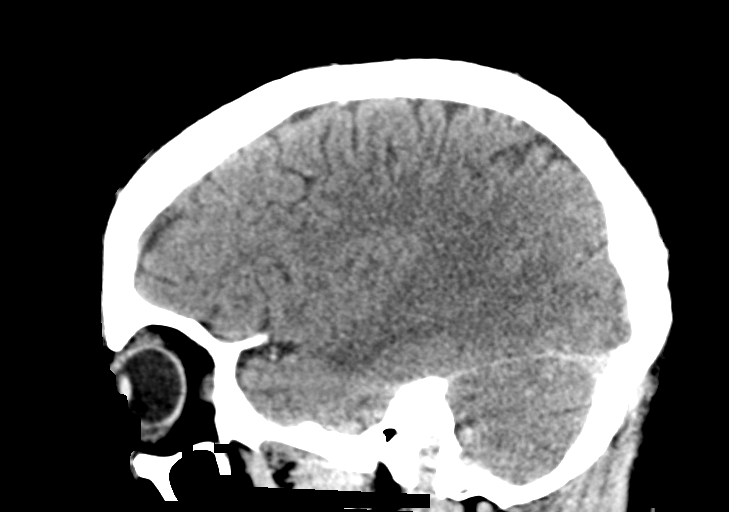

[Series 8: c_spine 2.0 st · axial · 0.21mm/px · z∈[-272,-228]mm · 3 of 89 slices shown]
[im 12/89  brain]
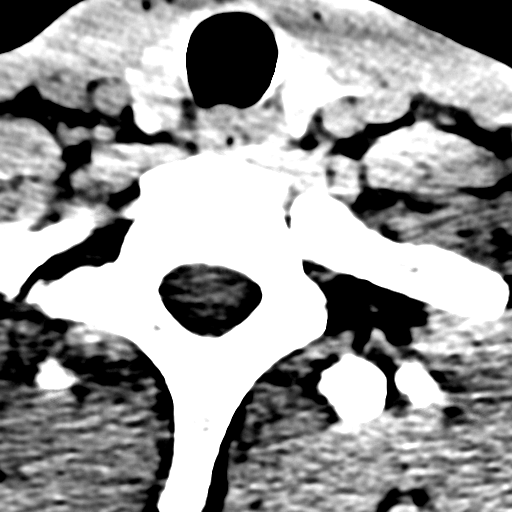
[im 23/89  brain]
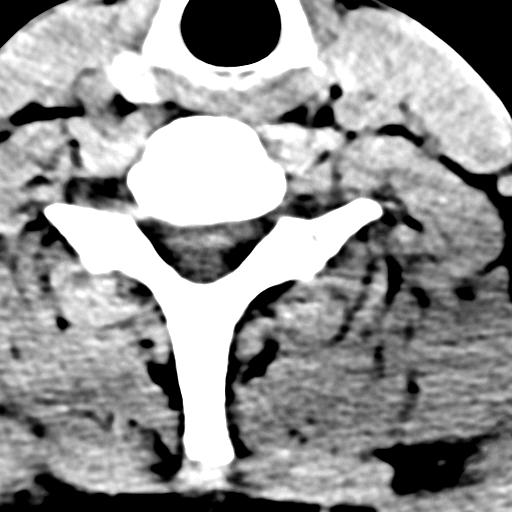
[im 34/89  brain]
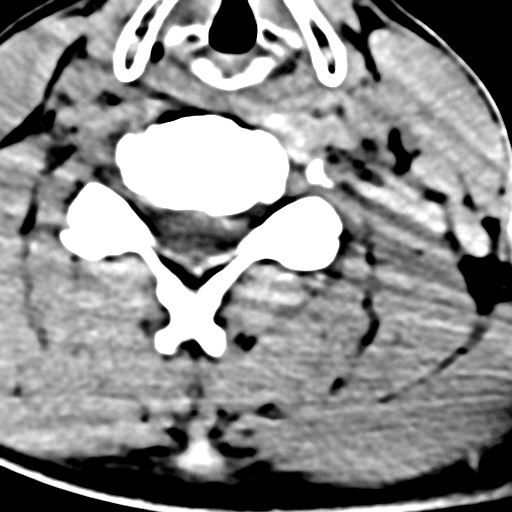

[16 of 47 positions shown; findings below may reference images not displayed]

FINDINGS: CT HEAD FINDINGS

BRAIN: The ventricles and sulci are normal. No intraparenchymal
hemorrhage, mass effect nor midline shift. No acute large vascular
territory infarcts. No abnormal extra-axial fluid collections. Basal
cisterns are midline and not effaced. No acute cerebellar
abnormality.

VASCULAR: Unremarkable.

SKULL/SOFT TISSUES: No skull fracture. No significant soft tissue
swelling.

ORBITS/SINUSES: The included ocular globes and orbital contents are
normal.The mastoid air-cells and included paranasal sinuses are
well-aerated.

OTHER: None.

CT CERVICAL SPINE FINDINGS

ALIGNMENT: Vertebral bodies in alignment. Maintained lordosis. Mild
dextroconvex curvature of the lower cervical spine with apex at C6.

SKULL BASE AND VERTEBRAE: Cervical vertebral bodies and posterior
elements are intact. No destructive or suspicious osseous lesions.
C1-2 articulation maintained. Uncinate spurring at C5-6 bilaterally
from uncovertebral joint osteoarthritis.

SOFT TISSUES AND SPINAL CANAL: No prevertebral soft tissue swelling.
No visible canal hematoma.

DISC LEVELS:

C2-C3: Small central disc protrusion. No significant central
foraminal stenosis.

C3-C4:  Negative

C4-C5: Moderate disc flattening with mild central to left central
disc bulge. No significant central or foraminal stenosis.

C5-C6: Moderate left central to subarticular disc protrusion with
left central osteophytes noted which may affect the exiting nerve
roots at C5-6. This may be better characterized with MRI.

C6-C7:  Negative

C7-T1:  Negative

UPPER CHEST: Lung apices are clear.

OTHER: None.
IMPRESSION: 1. Normal head CT.
2. Moderate left central to subarticular disc protrusion at C5-6
with dorsal osteophytes which may affect the left exiting nerve
roots at this level. This may be better characterized with MRI.
3. Small central disc protrusion C2-3.
4. Left mild central disc bulge C4-5.

## 2019-11-30 ENCOUNTER — Emergency Department (HOSPITAL_COMMUNITY): Payer: BC Managed Care – PPO

## 2019-11-30 ENCOUNTER — Emergency Department (HOSPITAL_COMMUNITY)
Admission: EM | Admit: 2019-11-30 | Discharge: 2019-11-30 | Disposition: A | Discharge status: Home / Self Care | Payer: BC Managed Care – PPO | Attending: Emergency Medicine | Admitting: Emergency Medicine

## 2019-11-30 ENCOUNTER — Encounter (HOSPITAL_COMMUNITY): Payer: Self-pay | Admitting: Emergency Medicine

## 2019-11-30 ENCOUNTER — Other Ambulatory Visit: Payer: Self-pay

## 2019-11-30 DIAGNOSIS — F17210 Nicotine dependence, cigarettes, uncomplicated: Secondary | ICD-10-CM | POA: Insufficient documentation

## 2019-11-30 DIAGNOSIS — Z23 Encounter for immunization: Secondary | ICD-10-CM | POA: Insufficient documentation

## 2019-11-30 DIAGNOSIS — Y9289 Other specified places as the place of occurrence of the external cause: Secondary | ICD-10-CM | POA: Diagnosis not present

## 2019-11-30 DIAGNOSIS — Y998 Other external cause status: Secondary | ICD-10-CM | POA: Insufficient documentation

## 2019-11-30 DIAGNOSIS — S52125A Nondisplaced fracture of head of left radius, initial encounter for closed fracture: Secondary | ICD-10-CM | POA: Diagnosis not present

## 2019-11-30 DIAGNOSIS — Y939 Activity, unspecified: Secondary | ICD-10-CM | POA: Diagnosis not present

## 2019-11-30 DIAGNOSIS — W010XXA Fall on same level from slipping, tripping and stumbling without subsequent striking against object, initial encounter: Secondary | ICD-10-CM | POA: Diagnosis not present

## 2019-11-30 DIAGNOSIS — S59902A Unspecified injury of left elbow, initial encounter: Secondary | ICD-10-CM | POA: Diagnosis present

## 2019-11-30 MED ORDER — TETANUS-DIPHTH-ACELL PERTUSSIS 5-2.5-18.5 LF-MCG/0.5 IM SUSP
0.5000 mL | Freq: Once | INTRAMUSCULAR | Status: AC
  Administered 2019-11-30: 0.5 mL via INTRAMUSCULAR
  Filled 2019-11-30: qty 0.5

## 2019-11-30 MED ORDER — NAPROXEN 500 MG PO TABS: 500.0000 mg | ORAL_TABLET | Freq: Two times a day (BID) | ORAL | 0 refills | Status: AC | PRN

## 2019-11-30 MED ORDER — HYDROCODONE-ACETAMINOPHEN 5-325 MG PO TABS: 1.0000 | ORAL_TABLET | Freq: Four times a day (QID) | ORAL | 0 refills | Status: AC | PRN

## 2019-11-30 NOTE — ED Triage Notes (Signed)
Patient's dog ran out and he tripped over her around 2am, landed on his L elbow, has it in a scarf sling and he thinks it is broken.

## 2019-11-30 NOTE — Discharge Instructions (Signed)
Please read and follow all provided instructions.  You have been seen today after an injury to your an injury to your left elbow Your xray shows a fracture of your radial head We have placed you in a splint- please keep this clean & dry and intact until you have followed up with orthopedics. Do not put any weight on this extremity Please call orthopedic surgery tomorrow to schedule an appointment within the next 3 days.   Home care instructions: -- *PRICE in the first 24-48 hours after injury: Protect with splint Rest Ice- Do not apply ice pack directly to your splint place towel or similar between your splint and ice/ice pack. Apply ice for 20 min, then remove for 40 min while awake Compression- splint Elevate affected extremity above the level of your heart when not walking around for the first 24-48 hours   Medications:  - Naproxen is a nonsteroidal anti-inflammatory medication that will help with pain and swelling. Be sure to take this medication as prescribed with food, 1 pill every 12 hours,  It should be taken with food, as it can cause stomach upset, and more seriously, stomach bleeding. Do not take other nonsteroidal anti-inflammatory medications with this such as Advil, Motrin, Aleve, Mobic, Goodie Powder, or Motrin.    If your pain is not alleviated by naproxen please take norco.   -Norco this is a narcotic/controlled substance medication that has potential addicting qualities.  We recommend that you take 1-2 tablets every 6 hours as needed for severe pain.  Do not drive or operate heavy machinery when taking this medicine as it can be sedating. Do not drink alcohol or take other sedating medications when taking this medicine for safety reasons.  Keep this out of reach of small children.  Please be aware this medicine has Tylenol in it (325 mg/tab) do not exceed the maximum dose of Tylenol in a day per over the counter recommendations should you decide to supplement with Tylenol over  the counter.   We have prescribed you new medication(s) today. Discuss the medications prescribed today with your pharmacist as they can have adverse effects and interactions with your other medicines including over the counter and prescribed medications. Seek medical evaluation if you start to experience new or abnormal symptoms after taking one of these medicines, seek care immediately if you start to experience difficulty breathing, feeling of your throat closing, facial swelling, or rash as these could be indications of a more serious allergic reaction   Follow-up instructions: Please follow-up with the orthopedic surgeon in your discharge instructions within 3 days.   Return instructions:  Please return if your digits or extremity are numb or tingling, appear gray or blue, or you have severe pain (also elevate the extremity and loosen splint or wrap if you were given one) Please return if you have redness or fevers.  Please return to the Emergency Department if you experience worsening symptoms.  Please return if you have any other emergent concerns.

## 2019-11-30 NOTE — ED Provider Notes (Addendum)
[facility]-EMERGENCY DEPT Provider Note   CSN: [id number] Arrival date & time: 11/30/19  1304     History Chief Complaint  Patient presents with  . Elbow Injury    Sean Barnes is a 39 y.o. male who presents to the ED with complaints of L elbow pain S/p injury earlier this morning. Patient states he tripped over his dog and fell backwards onto his L elbow. Denies head injury or LOC. States he is having pain to the L elbow with associated swelling, worse with movement, no alleviating factors. Intermittent tingling to the L hand, not constant, positional. Denies weakness, other areas of injury, fever, or chills.   HPI     Past Medical History:  Diagnosis Date  . Left hand weakness     There are no problems to display for this patient.   History reviewed. No pertinent surgical history.     History reviewed. No pertinent family history.  Social History   Tobacco Use  . Smoking status: Current Every Day Smoker    Types: Cigarettes  . Smokeless tobacco: Never Used  Substance Use Topics  . Alcohol use: Yes    Alcohol/week: 2.0 - 3.0 standard drinks    Types: 2 - 3 Cans of beer per week  . Drug use: Never    Home Medications Prior to Admission medications   Not on File    Allergies    Penicillins  Review of Systems   Review of Systems  Constitutional: Negative for chills and fever.  Eyes: Negative for visual disturbance.  Respiratory: Negative for shortness of breath.   Cardiovascular: Negative for chest pain.  Gastrointestinal: Negative for abdominal pain.  Musculoskeletal: Positive for arthralgias. Negative for back pain and neck pain.  Neurological: Positive for numbness (intermittent tingling to L hand based on position). Negative for weakness and headaches.    Physical Exam Updated Vital Signs BP (!) 144/93 (BP Location: Right Arm)   Pulse 61   Temp 98 F (36.7 C) (Oral)   Resp 16   Ht 6\' 4"  (1.93 m)   Wt 88.5 kg   SpO2 100%    BMI 23.74 kg/m    Physical Exam Vitals and nursing note reviewed.  Constitutional:      General: He is not in acute distress.    Appearance: He is well-developed. He is not toxic-appearing.  HENT:     Head: Normocephalic and atraumatic.     Comments: No raccoon eyes or battle sign. Eyes:     General:        Right eye: No discharge.        Left eye: No discharge.     Conjunctiva/sclera: Conjunctivae normal.     Pupils: Pupils are equal, round, and reactive to light.  Neck:     Comments: No midline spinal tenderness. Cardiovascular:     Rate and Rhythm: Normal rate and regular rhythm.     Pulses:          Radial pulses are 2+ on the right side and 2+ on the left side.  Pulmonary:     Effort: Pulmonary effort is normal. No respiratory distress.     Breath sounds: Normal breath sounds. No wheezing, rhonchi or rales.  Chest:     Chest wall: No tenderness.  Abdominal:     General: There is no distension.     Palpations: Abdomen is soft.     Tenderness: There is no abdominal tenderness. There is no guarding or rebound.  Musculoskeletal:     Cervical back: Normal range of motion and neck supple.     Comments: Upper extremities: Patient has an abrasion to the posterior left elbow without active bleeding, surrounding erythema, or purulent drainage, there is surrounding swelling to this area..  Intact active range of motion throughout the right upper extremity, left digits, wrist, and shoulder with good range of motion, left elbow is limited secondary to pain.  Patient is tender over the left olecranon.  Otherwise upper extremities are nontender Back: No midline tenderness Lower extremities: No focal bony tenderness.  Skin:    General: Skin is warm and dry.     Findings: No rash.  Neurological:     Mental Status: He is alert.     Comments: Clear speech.  Sensation grossly tact bilateral upper extremities.  5-5 symmetric grip strength.  A perform okay sign, thumbs up, cross  second/third digits bilaterally.  Psychiatric:        Behavior: Behavior normal.     ED Results / Procedures / Treatments   Labs (all labs ordered are listed, but only abnormal results are displayed) Labs Reviewed - No data to display  EKG None  Radiology DG Elbow Complete Left  Result Date: 11/30/2019 CLINICAL DATA:  Fall onto the left elbow today. Pain and inability to move elbow. EXAM: LEFT ELBOW - COMPLETE 3+ VIEW COMPARISON:  None. FINDINGS: On a single view, there is AP fracture across the radial head, along its anterior to central aspect, oriented in the sagittal plane. No other evidence of a fracture. Elbow joint normally spaced and aligned. No convincing joint effusion. Posterior soft tissue swelling. IMPRESSION: 1. Single-view demonstrates a linear, nondisplaced, non comminuted fracture of the radial head. 2. No other fractures.  Elbow joint normally aligned. Electronically Signed   By: Amie Portland M.D.   On: 11/30/2019 15:12    Procedures Procedures (including critical care time)  Medications Ordered in ED Medications  Tdap (BOOSTRIX) injection 0.5 mL (0.5 mLs Intramuscular Given 11/30/19 1530)    ED Course  I have reviewed the triage vital signs and the nursing notes.  Pertinent labs & imaging results that were available during my care of the patient were reviewed by me and considered in my medical decision making (see chart for details).    MDM Rules/Calculators/A&P                         Patient presents to the emergency department complains of left elbow pain status post injury earlier this morning.  Patient is nontoxic, resting comfortably, his vitals are without significant abnormality.  Seems to have isolated injury to the left elbow, there is an abrasion over the posterior elbow but this does not seem like it would be consistent with an open fracture given superficiality of it., tdap updated. X-ray reveals a nondisplaced nonunited fracture of the radial head,  patient is neurovascularly intact distally. Patient did report some mild intermittent numbness to the R .hand however this is not persistent and his sensation is intact on exam. Overall appears to be neurovascularly intact distally.  Exam also not consistent w/ compartment syndrome. Will place in posterior long-arm splint with sling immobilizer and orthopedics follow-up. I discussed results, treatment plan, need for follow-up, and return precautions with the patient. Provided opportunity for questions, patient confirmed understanding and is in agreement with plan.   Findings and plan of care discussed with supervising physician Dr. Judd Lien who is in agreement.  Final Clinical Impression(s) / ED Diagnoses Final diagnoses:  Closed nondisplaced fracture of head of left radius, initial encounter    Rx / DC Orders ED Discharge Orders         Ordered    HYDROcodone-acetaminophen (NORCO/VICODIN) 5-325 MG tablet  Every 6 hours PRN     Discontinue  Reprint     11/30/19 1534    naproxen (NAPROSYN) 500 MG tablet  2 times daily PRN     Discontinue  Reprint     11/30/19 1534           Cashton Hosley, Pleas Koch, PA-C 11/30/19 [address] [place], PA-C 11/30/19 1615    Geoffery Lyons, MD 12/01/19 [phone]
